# Patient Record
Sex: Female | Born: 1955 | Race: White | Hispanic: No | Marital: Married | Smoking: Never smoker
Health system: Southern US, Community
[De-identification: ages and names within clinical notes are randomized; demographics above are authoritative.]

## PROBLEM LIST (undated history)

## (undated) DIAGNOSIS — R569 Unspecified convulsions: Secondary | ICD-10-CM

## (undated) DIAGNOSIS — R51 Headache: Secondary | ICD-10-CM

## (undated) DIAGNOSIS — G43909 Migraine, unspecified, not intractable, without status migrainosus: Secondary | ICD-10-CM

## (undated) DIAGNOSIS — R519 Headache, unspecified: Secondary | ICD-10-CM

## (undated) HISTORY — DX: Migraine, unspecified, not intractable, without status migrainosus: G43.909

## (undated) HISTORY — DX: Headache: R51

## (undated) HISTORY — DX: Headache, unspecified: R51.9

## (undated) HISTORY — DX: Unspecified convulsions: R56.9

---

## 2011-11-09 ENCOUNTER — Ambulatory Visit: Payer: Self-pay

## 2012-02-25 ENCOUNTER — Ambulatory Visit: Payer: Self-pay | Admitting: Family Medicine

## 2012-12-22 ENCOUNTER — Ambulatory Visit: Payer: Self-pay | Admitting: Neurology

## 2014-06-13 DIAGNOSIS — R519 Headache, unspecified: Secondary | ICD-10-CM | POA: Insufficient documentation

## 2014-06-13 DIAGNOSIS — R569 Unspecified convulsions: Secondary | ICD-10-CM | POA: Insufficient documentation

## 2014-06-13 DIAGNOSIS — G479 Sleep disorder, unspecified: Secondary | ICD-10-CM | POA: Insufficient documentation

## 2014-06-13 DIAGNOSIS — R51 Headache: Secondary | ICD-10-CM

## 2014-10-25 LAB — HM PAP SMEAR: HM Pap smear: NORMAL

## 2014-11-25 LAB — HM MAMMOGRAPHY: HM Mammogram: NEGATIVE

## 2015-07-08 ENCOUNTER — Encounter: Payer: Self-pay | Admitting: Podiatry

## 2015-07-08 ENCOUNTER — Ambulatory Visit (INDEPENDENT_AMBULATORY_CARE_PROVIDER_SITE_OTHER): Payer: BLUE CROSS/BLUE SHIELD | Admitting: Podiatry

## 2015-07-08 VITALS — BP 112/82 | HR 96 | Resp 16 | Ht 61.0 in | Wt 125.0 lb

## 2015-07-08 DIAGNOSIS — M79676 Pain in unspecified toe(s): Secondary | ICD-10-CM

## 2015-07-08 DIAGNOSIS — B351 Tinea unguium: Secondary | ICD-10-CM | POA: Diagnosis not present

## 2015-07-08 DIAGNOSIS — L603 Nail dystrophy: Secondary | ICD-10-CM | POA: Diagnosis not present

## 2015-07-08 NOTE — Patient Instructions (Signed)

## 2015-07-10 NOTE — Progress Notes (Signed)
Patient ID: Sherry Mathis, female   DOB: 08/09/1956, 59 y.o.   MRN: [id number]  Subjective: 59 year old female presents the office with concerns of right hallux toenail discoloration, thickening. She also states that over the last several weeks she has noticed redness around the entire toenail and there is tenderness palpation along the base of the toenail and it has been progressive. She's been applying Neosporin around the nail borders without any relief. She denies any history of injury or trauma. Denies any drainage or purulence around the toenail. No other complaints at this time.  Objective: AAO x3, NAD DP/PT pulses palpable bilaterally, CRT less than 3 seconds Protective sensation intact with Simms Weinstein monofilament, vibratory sensation intact, Achilles tendon reflex intact There is tenderness to patient along the right hallux toenail. There is slight discoloration, hypertrophy and dystrophy the toenail. There is evidence of incurvation along both the medial and lateral nail borders. The majority the tenderness is localized to the proximal nail border. There is erythema surrounding the entire nail on both the medial, lateral, proximal nail borders however does not extend past the level of the IPJ. There is no ascending cellulitis. There is slight edema around the nail borders. The toenail does appear to be lifting underlying nail bed.  No other areas of tenderness to bilateral lower extremities. MMT 5/5, ROM WNL.  No open lesions or pre-ulcerative lesions.  No overlying edema, erythema, increase in warmth to bilateral lower extremities.  No pain with calf compression, swelling, warmth, erythema bilaterally.   Assessment: 59 year old female right hallux toenail pain, onychodystrophy of localized erythema  Plan: -Treatment options discussed including all alternatives, risks, and complications -At this time, recommended total nail removal without chemical matricectomy. Risks and  complications were discussed with the patient for which they understand and  verbally consent to the procedure. Under sterile conditions a total of 3 mL of a mixture of 2% lidocaine plain and 0.5% Marcaine plain was infiltrated in a hallux block fashion. Once anesthetized, the skin was prepped in sterile fashion. A tourniquet was then applied. Next the right hallux nail  was excised making sure to remove the entire offending nail borders. Once the nail was removed, the area was debrided and the underlying skin was intact. The area was irrigated and hemostasis was obtained. No purulence was expressed during the procedure. A dry sterile dressing was applied. After application of the dressing the tourniquet was removed and there is found to be an immediate capillary refill time to the digit. The patient tolerated the procedure well any complications. Post procedure instructions were discussed the patient for which he verbally understood. Follow-up in one week for nail check or sooner if any problems are to arise. Discussed signs/symptoms of worsening infection and directed to call the office immediately should any occur or go directly to the emergency room. In the meantime, encouraged to call the office with any questions, concerns, changes symptoms.

## 2015-07-22 ENCOUNTER — Telehealth: Payer: Self-pay | Admitting: *Deleted

## 2015-07-22 NOTE — Telephone Encounter (Signed)
Left message for patient that negative fungal can try nuvail if she wanted , call us back with any questions

## 2015-07-24 ENCOUNTER — Ambulatory Visit (INDEPENDENT_AMBULATORY_CARE_PROVIDER_SITE_OTHER): Payer: BLUE CROSS/BLUE SHIELD | Admitting: Podiatry

## 2015-07-24 DIAGNOSIS — L603 Nail dystrophy: Secondary | ICD-10-CM

## 2015-07-24 DIAGNOSIS — Z9889 Other specified postprocedural states: Secondary | ICD-10-CM

## 2015-07-24 NOTE — Patient Instructions (Signed)

## 2015-07-24 NOTE — Progress Notes (Signed)
Patient ID: HAILIE SEARIGHT, female   DOB: 03-11-1956, 59 y.o.   MRN: [id number]  Subjective:  59 year old female presents the office they for follow-up evaluation status post right hallux nail excision. She states that she's continue soaking her foot twice a day Epson salts cover with antibody ointment and a Band-Aid in the day. She was the area uncovered at night. She denies any pain around the area. She does state there is some slight discoloration along on the nail border which she points the proximal nail border although it does appear to be improving. She denies any red streaks. Denies any drainage or purulence. No other complaints at this time in no acute changes.  Objective: AAO x3, NAD Neurovascular status intact Status post right hallux total nail excision. There is a small amount granulation tissue present within the proximal nail border however the remainder of the nail bed and scabbed over. There is no tenderness palpation of the procedure site. There is a faint amount of erythema on the proximal nail border although this appears to be very mild and localized. There is no ascending saline disc. There is no areas of  Fluctuance or crepitus. No drainage or purulence. No malodor.  No other areas of tenderness to bilateral lower extremities. MMT 5/5, ROM WNL.  No open lesions or pre-ulcerative lesions.  No overlying edema, erythema, increase in warmth to bilateral lower extremities.  No pain with calf compression, swelling, warmth, erythema bilaterally.   Assessment: 59 year old female s/p right hallux total nail excision  Plan: Continue soaking in epsom salts twice a day followed by antibiotic ointment and a band-aid. Can leave uncovered at night. Continue this until completely healed.  If the area has not healed in 2 weeks, call the office for follow-up appointment, or sooner if any problems arise.  She wishes to hold off on nuvail or other treatment for her nails.  Monitor for any  signs/symptoms of infection. Call the office immediately if any occur or go directly to the emergency room. Call with any questions/concerns.  Ovid Curd, DPM

## 2015-08-01 ENCOUNTER — Encounter: Payer: Self-pay | Admitting: Podiatry

## 2015-10-06 ENCOUNTER — Telehealth: Payer: Self-pay | Admitting: *Deleted

## 2015-10-06 NOTE — Telephone Encounter (Signed)
Pt states she had a toenail removed 07/08/2015 and was calling about the path report.  Left message informing pt the fungal results were negative, could use OTC Revitaderm40 sold in the [facility] offices, apply to the toenails and it made them easier to trim and file thinner which often helped the appearance.

## 2015-10-30 ENCOUNTER — Ambulatory Visit (INDEPENDENT_AMBULATORY_CARE_PROVIDER_SITE_OTHER): Payer: BLUE CROSS/BLUE SHIELD | Admitting: Nurse Practitioner

## 2015-10-30 ENCOUNTER — Other Ambulatory Visit: Payer: Self-pay | Admitting: Nurse Practitioner

## 2015-10-30 ENCOUNTER — Encounter: Payer: Self-pay | Admitting: Nurse Practitioner

## 2015-10-30 VITALS — BP 104/80 | HR 77 | Temp 98.2°F | Resp 14 | Ht 61.0 in | Wt 125.8 lb

## 2015-10-30 DIAGNOSIS — Z1239 Encounter for other screening for malignant neoplasm of breast: Secondary | ICD-10-CM

## 2015-10-30 DIAGNOSIS — Z7689 Persons encountering health services in other specified circumstances: Secondary | ICD-10-CM

## 2015-10-30 DIAGNOSIS — R569 Unspecified convulsions: Secondary | ICD-10-CM | POA: Diagnosis not present

## 2015-10-30 DIAGNOSIS — R51 Headache: Secondary | ICD-10-CM | POA: Diagnosis not present

## 2015-10-30 DIAGNOSIS — Z7189 Other specified counseling: Secondary | ICD-10-CM | POA: Diagnosis not present

## 2015-10-30 DIAGNOSIS — R519 Headache, unspecified: Secondary | ICD-10-CM

## 2015-10-30 NOTE — Patient Instructions (Addendum)
Hepatitis C Screening (Hepatitis C Antibody [facility] lab) HIV Screening- (HIV Antibody with reflex- [facility])  Nice to meet you today.   Please follow up for your annual physical with fasting lab work.

## 2015-10-30 NOTE — Progress Notes (Signed)
Pre visit review using our clinic review tool, if applicable. No additional management support is needed unless otherwise documented below in the visit note. 

## 2015-10-30 NOTE — Progress Notes (Signed)
Patient ID: Sherry Mathis Suppes, female    DOB: 08/18/1956  Age: 59 y.o. MRN: [id number]  CC: Establish Care   HPI Sherry Mathis Dicioccio presents for establishing care and CC of mammogram order needed.   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 of 2015, no episodes since then   Migraines/Headaches- 18 per year approx, no visual changes, migraine medication like excedrin, rest and cold pack.   3) Acute Problems- Needs order for mammogram   [facility] Dr. Ardelle AntonWagoner- [facility]- great toe on right foot  [facility] Georgianne [facility]Vin [facility]  Dr. Ashley RoyaltyMatthews- Dentist   History Sherry Mathis has a past medical history of Seizures (HCC); Frequent headaches; and Migraines.   She has no past surgical history on file.   Her family history includes Cancer in her maternal aunt and mother.She reports that she has never smoked. She has never used smokeless tobacco. She reports that she drinks alcohol. She reports that she does not use illicit drugs.  No outpatient prescriptions prior to visit.   No facility-administered medications prior to visit.    ROS Review of Systems  Constitutional: Negative for fever, chills, diaphoresis and fatigue.  Respiratory: Negative for chest tightness, shortness of breath and wheezing.   Cardiovascular: Negative for chest pain, palpitations and leg swelling.  Gastrointestinal: Negative for nausea, vomiting and diarrhea.  Skin: Negative for rash.  Neurological: Negative for dizziness, weakness, numbness and headaches.  Psychiatric/Behavioral: The patient is not nervous/anxious.     Objective:  BP 104/80 mmHg  Pulse 77  Temp(Src) 98.2 F (36.8 C)  Resp 14   Ht 5\' 1"  (1.549 m)  Wt 125 lb 12.8 oz (57.063 kg)  BMI 23.78 kg/m2  SpO2 95%  Physical Exam  Constitutional: She is oriented to person, place, and time. She appears well-developed and well-nourished. No distress.  HENT:  Head: Normocephalic and atraumatic.  Right Ear: External ear normal.  Left Ear: External ear normal.  Cardiovascular: Normal rate, regular rhythm and normal heart sounds.  Exam reveals no gallop and no friction rub.   No murmur heard. Pulmonary/Chest: Effort normal and breath sounds normal. No respiratory distress. She has no wheezes. She has no rales. She exhibits no tenderness.  Neurological: She is alert and oriented to person, place, and time. No cranial nerve deficit. She exhibits normal muscle tone. Coordination normal.  Skin: Skin is warm and dry. No rash noted. She is not diaphoretic.  Psychiatric: She has a normal mood and affect. Her behavior is normal. Judgment and thought content normal.   Assessment & Plan:   Sherry Mathis was seen today for establish care.  Diagnoses and all orders for this visit:  Screening for breast cancer -     MM Digital Screening; Future  Encounter to establish care  Seizure [facility](HCC)  Nonintractable headache, unspecified chronicity pattern, unspecified headache type   I am having Ms. Walpole maintain her aspirin and Glucosamine-Chondroitin.  Meds ordered this encounter  Medications  . aspirin 81 MG tablet    Sig: Take 81 mg by mouth daily.  . Glucosamine-Chondroitin 750-600 MG CHEW    Sig: Chew 1 capsule by  mouth daily.     Follow-up: Return in about 3 months (around 01/30/2016) for CPE with fasting labs.

## 2015-11-03 DIAGNOSIS — Z7689 Persons encountering health services in other specified circumstances: Secondary | ICD-10-CM | POA: Insufficient documentation

## 2015-11-03 DIAGNOSIS — Z1239 Encounter for other screening for malignant neoplasm of breast: Secondary | ICD-10-CM | POA: Insufficient documentation

## 2015-11-03 NOTE — Assessment & Plan Note (Signed)
Discussed acute and chronic issues. Reviewed health maintenance measures, PFSHx, and immunizations. Obtain records from previous facility.   

## 2015-11-03 NOTE — Assessment & Plan Note (Signed)
Grand Mal seizure in past (2010) with "spells". Turned out to be epilepsy. Stable currently and is driving once again. Saw [facility] at [facility].

## 2015-11-03 NOTE — Assessment & Plan Note (Signed)
18 years approx of headaches. Denies aura. Migraine OTC medication and rest is helpful. Will follow

## 2015-11-03 NOTE — Assessment & Plan Note (Addendum)
Mammogram order placed for Sherry Mathis. Pt was given number to call and set up. Denies implants or past problems. Will follow

## 2015-11-27 ENCOUNTER — Ambulatory Visit
Admission: RE | Admit: 2015-11-27 | Discharge: 2015-11-27 | Disposition: A | Discharge status: Home / Self Care | Payer: BLUE CROSS/BLUE SHIELD | Source: Ambulatory Visit | Attending: Nurse Practitioner | Admitting: Nurse Practitioner

## 2015-11-27 DIAGNOSIS — Z1231 Encounter for screening mammogram for malignant neoplasm of breast: Secondary | ICD-10-CM | POA: Insufficient documentation

## 2015-11-27 DIAGNOSIS — Z1239 Encounter for other screening for malignant neoplasm of breast: Secondary | ICD-10-CM

## 2016-11-10 ENCOUNTER — Other Ambulatory Visit: Payer: Self-pay | Admitting: Obstetrics and Gynecology

## 2016-11-10 DIAGNOSIS — Z1231 Encounter for screening mammogram for malignant neoplasm of breast: Secondary | ICD-10-CM

## 2016-12-21 ENCOUNTER — Ambulatory Visit
Admission: RE | Admit: 2016-12-21 | Discharge: 2016-12-21 | Disposition: A | Discharge status: Home / Self Care | Payer: BLUE CROSS/BLUE SHIELD | Source: Ambulatory Visit | Attending: Obstetrics and Gynecology | Admitting: Obstetrics and Gynecology

## 2016-12-21 DIAGNOSIS — Z1231 Encounter for screening mammogram for malignant neoplasm of breast: Secondary | ICD-10-CM | POA: Insufficient documentation

## 2017-11-11 ENCOUNTER — Other Ambulatory Visit: Payer: Self-pay | Admitting: Obstetrics and Gynecology

## 2017-11-11 DIAGNOSIS — Z1231 Encounter for screening mammogram for malignant neoplasm of breast: Secondary | ICD-10-CM

## 2017-12-22 ENCOUNTER — Ambulatory Visit
Admission: RE | Admit: 2017-12-22 | Discharge: 2017-12-22 | Disposition: A | Discharge status: Home / Self Care | Payer: BLUE CROSS/BLUE SHIELD | Source: Ambulatory Visit | Attending: Obstetrics and Gynecology | Admitting: Obstetrics and Gynecology

## 2017-12-22 DIAGNOSIS — Z1231 Encounter for screening mammogram for malignant neoplasm of breast: Secondary | ICD-10-CM

## 2018-09-04 ENCOUNTER — Other Ambulatory Visit: Payer: Self-pay | Admitting: Obstetrics and Gynecology

## 2018-09-04 DIAGNOSIS — Z1231 Encounter for screening mammogram for malignant neoplasm of breast: Secondary | ICD-10-CM

## 2018-12-25 ENCOUNTER — Ambulatory Visit
Admission: RE | Admit: 2018-12-25 | Discharge: 2018-12-25 | Disposition: A | Discharge status: Home / Self Care | Payer: BLUE CROSS/BLUE SHIELD | Source: Ambulatory Visit | Attending: Obstetrics and Gynecology | Admitting: Obstetrics and Gynecology

## 2018-12-25 DIAGNOSIS — Z1231 Encounter for screening mammogram for malignant neoplasm of breast: Secondary | ICD-10-CM | POA: Diagnosis not present

## 2019-04-17 ENCOUNTER — Ambulatory Visit: Admission: RE | Admit: 2019-04-17 | Payer: Medicare HMO | Source: Home / Self Care | Admitting: Gastroenterology

## 2019-04-17 ENCOUNTER — Encounter: Admission: RE | Payer: Self-pay | Source: Home / Self Care

## 2019-04-17 SURGERY — COLONOSCOPY WITH PROPOFOL
Anesthesia: General

## 2020-04-24 ENCOUNTER — Other Ambulatory Visit: Payer: Self-pay | Admitting: Family Medicine

## 2020-04-24 ENCOUNTER — Other Ambulatory Visit: Payer: Self-pay | Admitting: Obstetrics and Gynecology

## 2020-04-24 DIAGNOSIS — Z1231 Encounter for screening mammogram for malignant neoplasm of breast: Secondary | ICD-10-CM

## 2020-04-25 ENCOUNTER — Ambulatory Visit
Admission: RE | Admit: 2020-04-25 | Discharge: 2020-04-25 | Disposition: A | Discharge status: Home / Self Care | Payer: Medicare HMO | Source: Ambulatory Visit | Attending: Family Medicine | Admitting: Family Medicine

## 2020-04-25 DIAGNOSIS — Z1231 Encounter for screening mammogram for malignant neoplasm of breast: Secondary | ICD-10-CM

## 2020-12-27 HISTORY — PX: COLONOSCOPY: SHX174

## 2021-01-21 ENCOUNTER — Other Ambulatory Visit: Payer: Self-pay | Admitting: Family Medicine

## 2021-01-21 DIAGNOSIS — Z1231 Encounter for screening mammogram for malignant neoplasm of breast: Secondary | ICD-10-CM

## 2021-05-07 ENCOUNTER — Other Ambulatory Visit: Payer: Self-pay

## 2021-05-07 ENCOUNTER — Ambulatory Visit
Admission: RE | Admit: 2021-05-07 | Discharge: 2021-05-07 | Disposition: A | Discharge status: Home / Self Care | Payer: Medicare HMO | Source: Ambulatory Visit | Attending: Family Medicine | Admitting: Family Medicine

## 2021-05-07 DIAGNOSIS — Z1231 Encounter for screening mammogram for malignant neoplasm of breast: Secondary | ICD-10-CM | POA: Diagnosis not present

## 2022-03-29 ENCOUNTER — Other Ambulatory Visit: Payer: Self-pay | Admitting: Family Medicine

## 2022-03-29 DIAGNOSIS — Z1231 Encounter for screening mammogram for malignant neoplasm of breast: Secondary | ICD-10-CM

## 2022-05-10 ENCOUNTER — Ambulatory Visit
Admission: RE | Admit: 2022-05-10 | Discharge: 2022-05-10 | Disposition: A | Discharge status: Home / Self Care | Payer: Medicare HMO | Source: Ambulatory Visit | Attending: Family Medicine | Admitting: Family Medicine

## 2022-05-10 DIAGNOSIS — Z1231 Encounter for screening mammogram for malignant neoplasm of breast: Secondary | ICD-10-CM | POA: Diagnosis present

## 2022-05-14 ENCOUNTER — Other Ambulatory Visit: Payer: Self-pay | Admitting: Family Medicine

## 2022-05-14 ENCOUNTER — Ambulatory Visit
Admission: RE | Admit: 2022-05-14 | Discharge: 2022-05-14 | Disposition: A | Discharge status: Home / Self Care | Payer: Medicare HMO | Source: Ambulatory Visit | Attending: Family Medicine | Admitting: Family Medicine

## 2022-05-14 DIAGNOSIS — R921 Mammographic calcification found on diagnostic imaging of breast: Secondary | ICD-10-CM

## 2022-05-14 DIAGNOSIS — R928 Other abnormal and inconclusive findings on diagnostic imaging of breast: Secondary | ICD-10-CM | POA: Diagnosis present

## 2022-05-18 ENCOUNTER — Other Ambulatory Visit: Payer: Self-pay | Admitting: Family Medicine

## 2022-05-18 DIAGNOSIS — R928 Other abnormal and inconclusive findings on diagnostic imaging of breast: Secondary | ICD-10-CM

## 2022-05-18 DIAGNOSIS — R921 Mammographic calcification found on diagnostic imaging of breast: Secondary | ICD-10-CM

## 2022-05-23 IMAGING — MG MM DIGITAL SCREENING BILAT W/ TOMO AND CAD
6 of 10 series · 6 of 30 positions shown · non-contrast
Comparison: Previous exam(s).

CLINICAL DATA: Screening.

EXAM:
DIGITAL SCREENING BILATERAL MAMMOGRAM WITH TOMOSYNTHESIS AND CAD
TECHNIQUE: Bilateral screening digital craniocaudal and mediolateral oblique
mammograms were obtained. Bilateral screening digital breast
tomosynthesis was performed. The images were evaluated with
computer-aided detection.

[L MLO synth-2D]
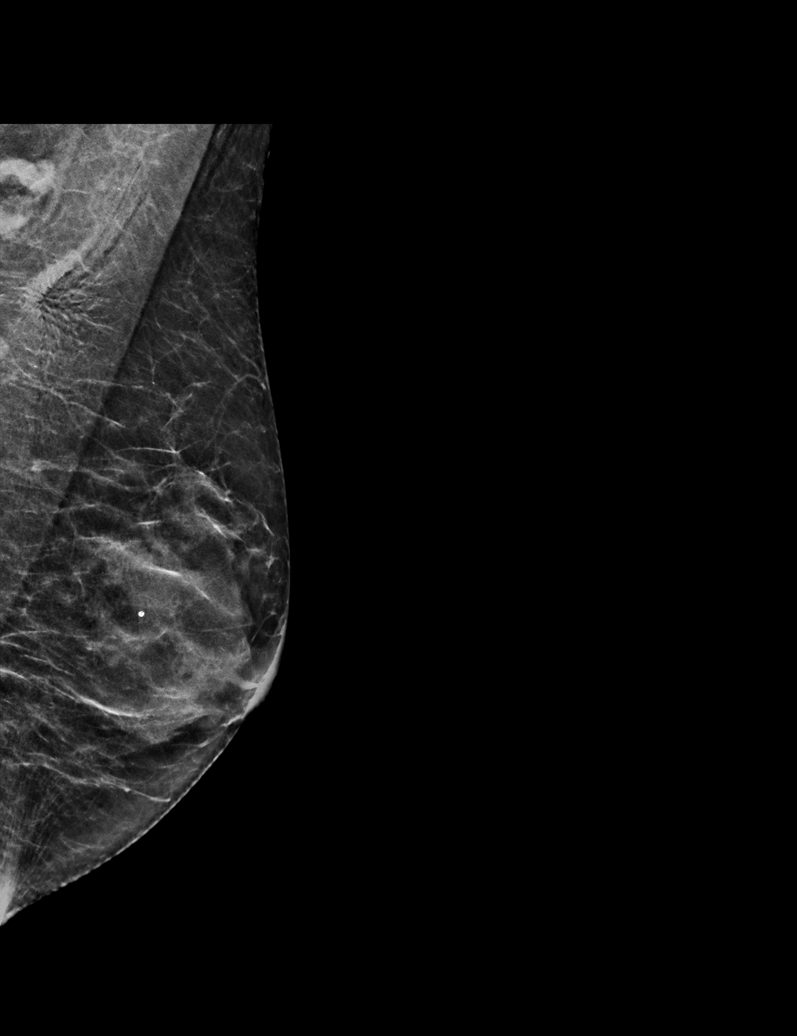

[R CC synth-2D]
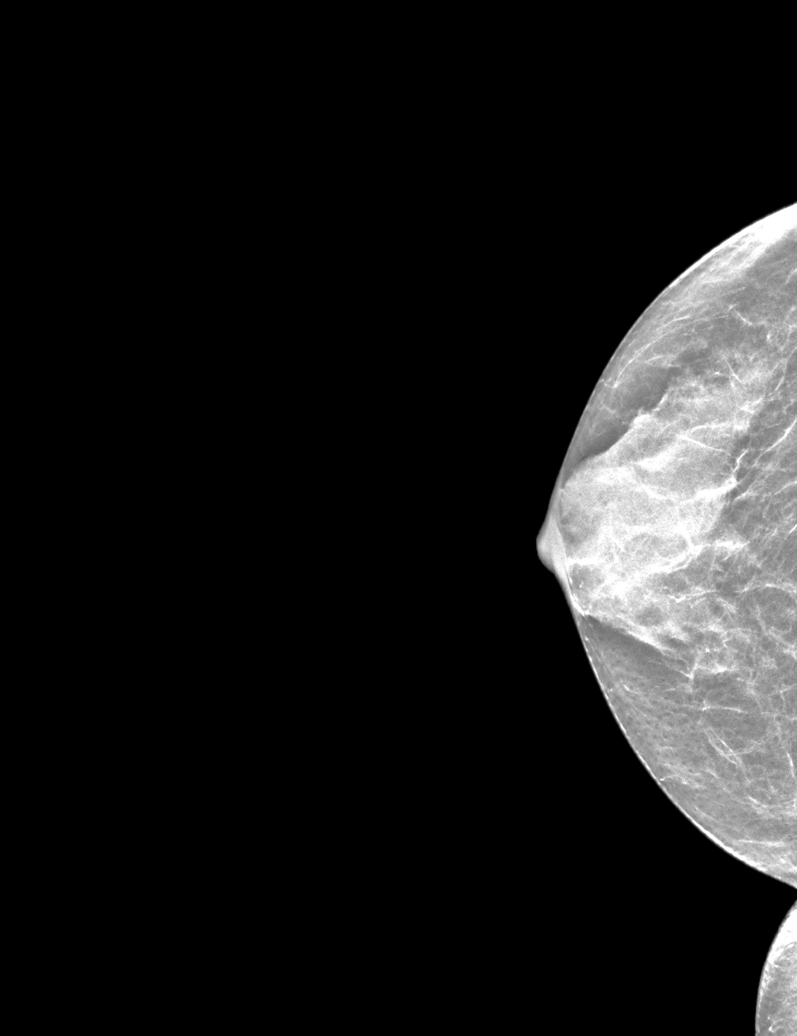

[R MLO synth-2D (1 of 2)]
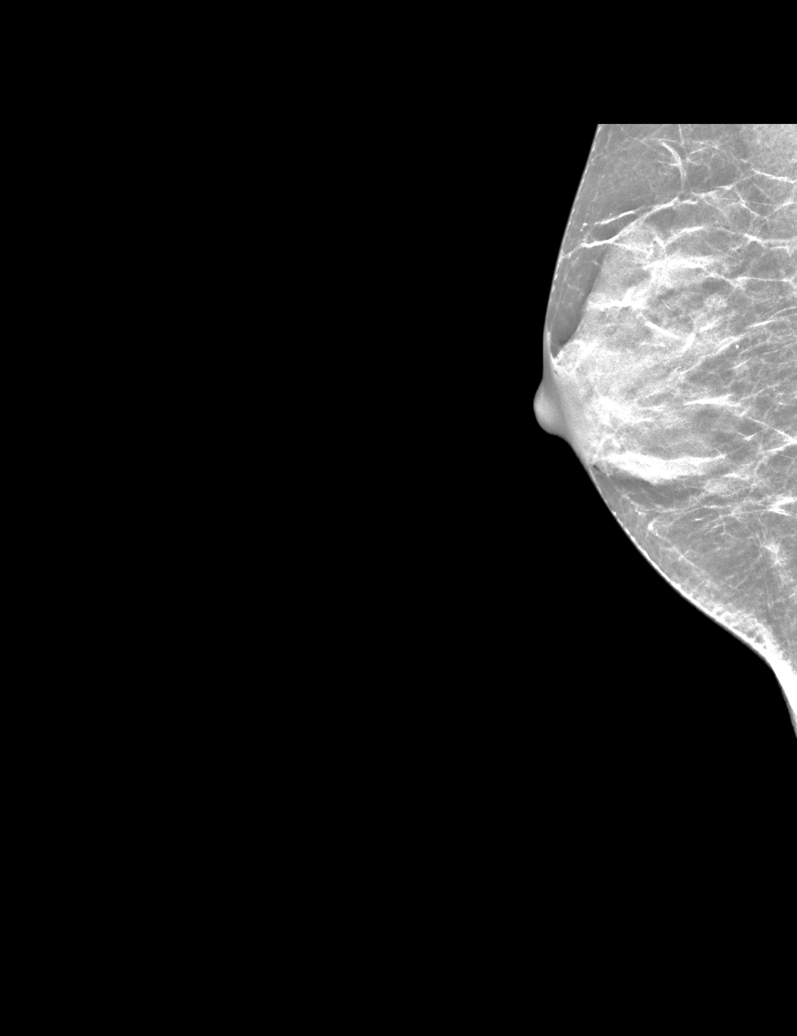

[R MLO synth-2D (2 of 2)]
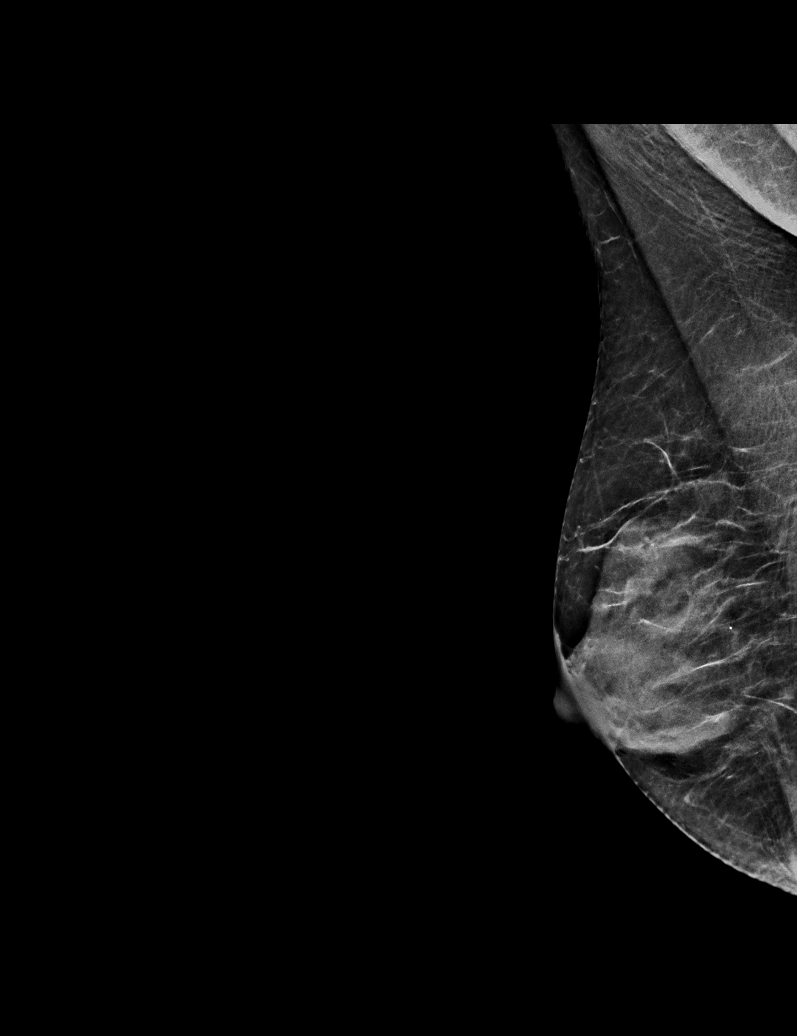

[L CC synth-2D]
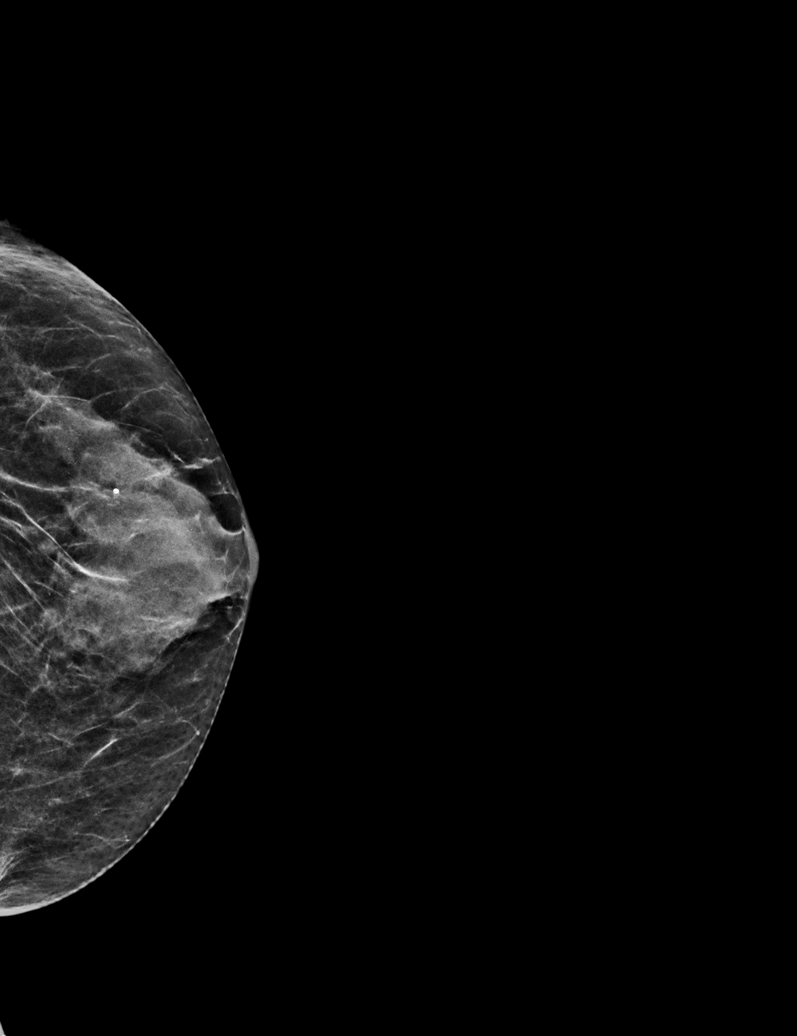

[L CC tomo · tomo slice 23/44.0]
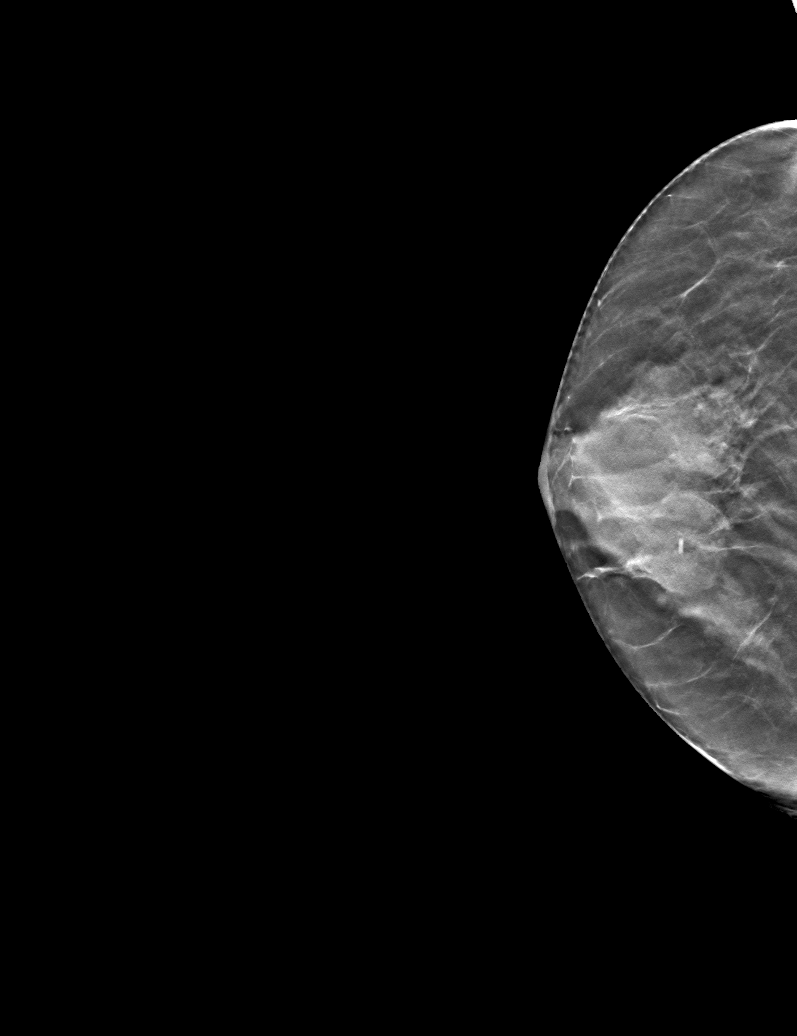

[6 of 30 positions shown; findings below may reference images not displayed]

ACR Breast Density Category c: The breast tissue is heterogeneously
dense, which may obscure small masses.
FINDINGS: There are no findings suspicious for malignancy. The images were
evaluated with computer-aided detection.
IMPRESSION: No mammographic evidence of malignancy. A result letter of this
screening mammogram will be mailed directly to the patient.

RECOMMENDATION:
Screening mammogram in one year. (Code:T4-5-GWO)

BI-RADS CATEGORY  1: Negative.

## 2022-06-02 ENCOUNTER — Ambulatory Visit
Admission: RE | Admit: 2022-06-02 | Discharge: 2022-06-02 | Disposition: A | Discharge status: Home / Self Care | Payer: Medicare HMO | Source: Ambulatory Visit | Attending: Family Medicine | Admitting: Family Medicine

## 2022-06-02 DIAGNOSIS — R921 Mammographic calcification found on diagnostic imaging of breast: Secondary | ICD-10-CM | POA: Insufficient documentation

## 2022-06-02 DIAGNOSIS — R928 Other abnormal and inconclusive findings on diagnostic imaging of breast: Secondary | ICD-10-CM | POA: Diagnosis present

## 2022-06-02 HISTORY — PX: BREAST BIOPSY: SHX20

## 2022-06-03 ENCOUNTER — Encounter: Payer: Self-pay | Admitting: *Deleted

## 2022-06-03 LAB — SURGICAL PATHOLOGY

## 2022-06-03 NOTE — Progress Notes (Signed)
Referral recieved from [facility] for Left breast atypical lobular hyperplasia.  Appointment scheduled for surgical consultation with Dr. Lemar Livings per patient request on 06/10/22.   No further needs at this time.

## 2022-06-10 ENCOUNTER — Other Ambulatory Visit: Payer: Self-pay | Admitting: General Surgery

## 2022-06-10 DIAGNOSIS — D0502 Lobular carcinoma in situ of left breast: Secondary | ICD-10-CM

## 2022-06-10 NOTE — Progress Notes (Signed)
Subjective:     Patient ID: Sherry Mathis is a 66 y.o. female.   HPI   The following portions of the patient's history were reviewed and updated as appropriate.   This a new patient is here today for: office visit. She is here to discuss treatment for her most recent left breast biopsy, 06-02-22 referred by Dr Kary Kos. She states she does self breast checks and could not fele anything in the breast prior to her mammogram. She denies any breast issues or trauma.   She states she wears a 36 AA bra.   The patient, a native of [place], [place] met her husband when she was attending [employer].  She was an Equities trader education major.  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  Fortunately, not an unpleasant memory.   She is here with her husband, Jeneen Rinks.   Review of Systems  Constitutional:  Negative for chills and fever.  Respiratory:  Negative for cough.        Chief Complaint  Patient presents with   New Patient      BP 92/64   Pulse 90   Temp 36.9 C (98.4 F)   Ht 154.9 cm (5' 1")   Wt 51.3 kg (113 lb)   SpO2 98%   BMI 21.35 kg/m        Past Medical History:  Diagnosis Date   Headache 06/13/2014   Migraine     Seizures (CMS-HCC) 2010    one grand mal in 2010, none since-topamax x 5 years then d/c under Dr Melrose Nakayama   Sleep disorder 06/13/2014           Past Surgical History:  Procedure Laterality Date   COLONOSCOPY   2010    [place]-nml per pt.   COLONOSCOPY   12/23/2021    Int. hem/Otherwise normal colon/Repeat 48yr/SMR   BIOPSY BREAST W/ LOC DEVICE PLACEMENT AND STEREOTACTIC GUIDANCE Left 06/02/2022   ARTHROSCOPIC ROTATOR CUFF REPAIR            OB History       Gravida  1   Para  1   Term  1   Preterm      AB      Living  1        SAB      IAB      Ectopic      Molar      Multiple      Live Births  1        Obstetric Comments  Age at first period 89Age of first pregnancy 89              Social  History           Socioeconomic History   Marital status: Married  Tobacco Use   Smoking status: Never   Smokeless tobacco: Never  Substance and Sexual Activity   Alcohol use: Yes      Alcohol/week: 0.0 standard drinks      Comment: occasional   Drug use: No   Sexual activity: Yes      Partners: Male      Birth control/protection: Post-menopausal  Other Topics Concern   Would you please tell uKoreaabout the people who live in your home, your pets, or anything else important to your social life? No        No Known Allergies   Current Medications        Current Outpatient Medications  Medication Sig Dispense Refill   ascorbic acid, vitamin C, 250 mg Chew Take 1 tablet by mouth once daily       aspirin 81 MG chewable tablet Take 81 mg by mouth once daily       calcium carbonate (CALCIUM 600 ORAL) Take by mouth       erythromycin (ROMYCIN) ophthalmic ointment Place 0.5 inches into the left eye as needed       multivitamin Chew Take 1 tablet by mouth once daily        No current facility-administered medications for this visit.             Family History  Problem Relation Age of Onset   Breast cancer Mother 76   Heart failure Father 28   Cancer Maternal Grandmother          lung   Myocardial Infarction (Heart attack) Maternal Grandfather     Cancer Paternal Grandfather          liver   Breast cancer Maternal Aunt 99        Labs and Radiology:    June 02, 2022 stereotactic biopsy:   DIAGNOSIS:  A.  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  - CALCIFICATION IDENTIFIED.    COMMENT: After the first levels on block A are examined and no  calcifications seen the block is leveled thru which reveals  calcification.    Estimated volume of tissue removed: 2 cm.   Imaging review:   Mammograms from April 25, 2020 through June 02, 2022 were  reviewed.  New development of microcalcifications on retrospective review of the Feb 14, 2021 films, more pronounced in 02-14-22.   Review of the postbiopsy films shows no residual microcalcifications.  Notation made by the radiologist that the clip is migrated 6 mm medial to the biopsy site.   August 05, 2021 laboratory:   Glucose 70 - 110 mg/dL 86  Sodium 136 - 145 mmol/L 140  Potassium 3.6 - 5.1 mmol/L 4.1  Chloride 97 - 109 mmol/L 105  Carbon Dioxide (CO2) 22.0 - 32.0 mmol/L 29.0  Urea Nitrogen (BUN) 7 - 25 mg/dL 15  Creatinine 0.6 - 1.1 mg/dL 0.8  Glomerular Filtration Rate (eGFR), MDRD Estimate >60 mL/min/1.73sq m 72  Calcium 8.7 - 10.3 mg/dL 9.4  AST 8 - 39 U/L 16  ALT 5 - 38 U/L 13  Alk Phos (alkaline Phosphatase) 34 - 104 U/L 63  Albumin 3.5 - 4.8 g/dL 4.3  Bilirubin, Total 0.3 - 1.2 mg/dL 0.6  Protein, Total 6.1 - 7.9 g/dL 7.0  A/G Ratio 1.0 - 5.0 gm/dL 1.6    WBC (White Blood Cell Count) 4.1 - 10.2 10^3/uL 5.4  RBC (Red Blood Cell Count) 4.04 - 5.48 10^6/uL 4.66  Hemoglobin 12.0 - 15.0 gm/dL 13.5  Hematocrit 35.0 - 47.0 % 42.9  MCV (Mean Corpuscular Volume) 80.0 - 100.0 fl 92.1  MCH (Mean Corpuscular Hemoglobin) 27.0 - 31.2 pg 29.0  MCHC (Mean Corpuscular Hemoglobin Concentration) 32.0 - 36.0 gm/dL 31.5 Low   Platelet Count 150 - 450 10^3/uL 211  RDW-CV (Red Cell Distribution Width) 11.6 - 14.8 % 12.6  MPV (Mean Platelet Volume) 9.4 - 12.4 fl 10.4  Neutrophils 1.50 - 7.80 10^3/uL 2.50  Lymphocytes 1.00 - 3.60 10^3/uL 2.50  Mixed Count 0.10 - 0.90 10^3/uL 0.40  Neutrophil % 32.0 - 70.0 % 45.7  Lymphocyte % 10.0 - 50.0 %  46.0  Mixed % 3.0 - 14.4 % 8.3    BRCA testing:   The patient reports that she underwent BRCA testing through her gynecologist on October 23, 2012 and had no abnormalities detected.  This was completed at [place].  She will look to see if a copy is available in her records.  Review of the [facility] EMR is unhelpful.    Objective:   Physical Exam Exam  conducted with a chaperone present.  Constitutional:      Appearance: Normal appearance.  Cardiovascular:     Rate and Rhythm: Normal rate and regular rhythm.     Pulses: Normal pulses.     Heart sounds: Normal heart sounds.  Pulmonary:     Effort: Pulmonary effort is normal.     Breath sounds: Normal breath sounds.  Chest:  Breasts:    Right: Normal.     Left: Normal.        Comments: Moderate bruising at the site of biopsy in the left breast.  Small hematoma.   6 mm pearllike lesion on the upper chest below the manubrium, concerning for basal cell carcinoma. Musculoskeletal:     Cervical back: Neck supple.  Lymphadenopathy:     Upper Body:     Right upper body: No supraclavicular or axillary adenopathy.     Left upper body: No supraclavicular or axillary adenopathy.  Skin:    General: Skin is warm and dry.  Neurological:     Mental Status: She is alert and oriented to person, place, and time.  Psychiatric:        Mood and Affect: Mood normal.        Behavior: Behavior normal.         Assessment:     LCIS of the left breast.   Possible basal cell carcinoma of the anterior chest.    Plan:     In light of the pre and postbiopsy films, likely all of the tissue of concern has been removed.  Indications for local excision to confirm no residual abnormal tissue is appropriate.   With the discrepancy between the clip and the original biopsy site, she will benefit from preoperative wire localization.   Excision of the area of concern in the skin of the anterior chest will be completed at the time of her biopsy.   We will attempt to obtain a copy of her BRCA testing to confirm her report.          This note is partially prepared by Karie Fetch, RN, acting as a scribe in the presence of Dr. Hervey Ard, MD.  The documentation recorded by the scribe accurately reflects the service I personally performed and the decisions made by me.    Robert Bellow, MD FACS

## 2022-06-11 ENCOUNTER — Other Ambulatory Visit: Payer: Self-pay | Admitting: General Surgery

## 2022-06-11 DIAGNOSIS — D0502 Lobular carcinoma in situ of left breast: Secondary | ICD-10-CM

## 2022-06-15 ENCOUNTER — Encounter
Admission: RE | Admit: 2022-06-15 | Discharge: 2022-06-15 | Disposition: A | Discharge status: Home / Self Care | Payer: Medicare HMO | Source: Ambulatory Visit | Attending: General Surgery | Admitting: General Surgery

## 2022-06-15 ENCOUNTER — Other Ambulatory Visit: Payer: Self-pay

## 2022-06-15 NOTE — Patient Instructions (Addendum)
Your procedure is scheduled on: Monday 06/21/22 Report to the Registration Desk on the 1st floor of the [facility]. To find out your arrival time, please call [phone] between 1PM - 3PM on: Friday 06/18/22 If your arrival time is 6:00 am, do not arrive prior to that time as the [facility] entrance doors do not open until 6:00 am.  REMEMBER: Instructions that are not followed completely may result in serious medical risk, up to and including death; or upon the discretion of your surgeon and anesthesiologist your surgery may need to be rescheduled.  Do not eat food after midnight the night before surgery.  No gum chewing, lozengers or hard candies.  You may however, drink CLEAR liquids up to 2 hours before you are scheduled to arrive for your surgery. Do not drink anything within 2 hours of your scheduled arrival time.  Clear liquids include: - water  - apple juice without pulp - gatorade (not RED colors) - black coffee or tea (Do NOT add milk or creamers to the coffee or tea) Do NOT drink anything that is not on this list.  TAKE THESE MEDICATIONS THE MORNING OF SURGERY WITH A SIP OF WATER: NONE  Hold Asprin day of surgery.  One week prior to surgery: Stop Anti-inflammatories (NSAIDS) such as Advil, Aleve, Ibuprofen, Motrin, Naproxen, Naprosyn and Aspirin based products such as Excedrin, Goodys Powder, BC Powder.  Stop taking your Glucosamine-Chondroitin 750-600 MG CHEW and Calcium ANY OVER THE COUNTER supplements until after surgery.  You may however, continue to take Tylenol if needed for pain up until the day of surgery.  No Alcohol for 24 hours before or after surgery.  No Smoking including e-cigarettes for 24 hours prior to surgery.  No chewable tobacco products for at least 6 hours prior to surgery.  No nicotine patches on the day of surgery.  Do not use any "recreational" drugs for at least a week prior to your surgery.  Please be advised that the combination of  cocaine and anesthesia may have negative outcomes, up to and including death. If you test positive for cocaine, your surgery will be cancelled.  On the morning of surgery brush your teeth with toothpaste and water, you may rinse your mouth with mouthwash if you wish. Do not swallow any toothpaste or mouthwash.  Use CHG wipes the morning of surgery at the hospital.  Do not wear jewelry, make-up, hairpins, clips or nail polish.  Do not wear lotions, powders, or perfumes.   Do not shave body from the neck down 48 hours prior to surgery just in case you cut yourself which could leave a site for infection.  Also, freshly shaved skin may become irritated if using the CHG soap.  Do not bring valuables to the hospital. [facility] is not responsible for any missing/lost belongings or valuables.   Notify your doctor if there is any change in your medical condition (cold, fever, infection).  Wear comfortable clothing (specific to your surgery type) to the hospital.  After surgery, you can help prevent lung complications by doing breathing exercises.  Take deep breaths and cough every 1-2 hours.  If you are being discharged the day of surgery, you will not be allowed to drive home. You will need a responsible adult (18 years or older) to drive you home and stay with you that night.   If you are taking public transportation, you will need to have a responsible adult (18 years or older) with you. Please confirm  with your physician that it is acceptable to use public transportation.   Please call the Pre-admissions Testing Dept. at [phone] if you have any questions about these instructions.  Surgery Visitation Policy:  Patients undergoing a surgery or procedure may have two family members or support persons with them as long as the person is not COVID-19 positive or experiencing its symptoms.   Inpatient Visitation:    Visiting hours are 7 a.m. to 8 p.m. Up to four visitors are  allowed at one time in a patient room, including children. The visitors may rotate out with other people during the day. One designated support person (adult) may remain overnight.

## 2022-06-20 MED ORDER — CHLORHEXIDINE GLUCONATE CLOTH 2 % EX PADS: 6.0000 | MEDICATED_PAD | Freq: Once | CUTANEOUS | Status: DC

## 2022-06-20 MED ORDER — LACTATED RINGERS IV SOLN: INTRAVENOUS | Status: DC

## 2022-06-20 MED ORDER — CHLORHEXIDINE GLUCONATE 0.12 % MT SOLN: 15.0000 mL | Freq: Once | OROMUCOSAL | Status: AC

## 2022-06-20 MED ORDER — ORAL CARE MOUTH RINSE: 15.0000 mL | Freq: Once | OROMUCOSAL | Status: AC

## 2022-06-20 MED ORDER — FAMOTIDINE 20 MG PO TABS: 20.0000 mg | ORAL_TABLET | Freq: Once | ORAL | Status: AC

## 2022-06-21 ENCOUNTER — Other Ambulatory Visit: Payer: Self-pay

## 2022-06-21 ENCOUNTER — Ambulatory Visit
Admission: RE | Admit: 2022-06-21 | Discharge: 2022-06-21 | Disposition: A | Discharge status: Home / Self Care | Payer: Medicare HMO | Attending: General Surgery | Admitting: General Surgery

## 2022-06-21 ENCOUNTER — Ambulatory Visit: Payer: Medicare HMO | Admitting: Anesthesiology

## 2022-06-21 ENCOUNTER — Ambulatory Visit
Admission: RE | Admit: 2022-06-21 | Discharge: 2022-06-21 | Disposition: A | Discharge status: Home / Self Care | Payer: Medicare HMO | Source: Ambulatory Visit | Attending: General Surgery | Admitting: General Surgery

## 2022-06-21 ENCOUNTER — Encounter
Admission: RE | Disposition: A | Discharge status: Home / Self Care | Payer: Self-pay | Source: Home / Self Care | Attending: General Surgery

## 2022-06-21 ENCOUNTER — Encounter: Payer: Self-pay | Admitting: General Surgery

## 2022-06-21 DIAGNOSIS — D0502 Lobular carcinoma in situ of left breast: Secondary | ICD-10-CM | POA: Insufficient documentation

## 2022-06-21 HISTORY — PX: BREAST LUMPECTOMY WITH NEEDLE LOCALIZATION: SHX5759

## 2022-06-21 HISTORY — PX: MOLE REMOVAL: SHX2046

## 2022-06-21 HISTORY — PX: BREAST LUMPECTOMY: SHX2

## 2022-06-21 SURGERY — BREAST LUMPECTOMY WITH NEEDLE LOCALIZATION
Anesthesia: General | Site: Chest

## 2022-06-21 MED ORDER — PROPOFOL 10 MG/ML IV BOLUS
INTRAVENOUS | Status: DC | PRN
  Administered 2022-06-21: 100 mg via INTRAVENOUS

## 2022-06-21 MED ORDER — MIDAZOLAM HCL 2 MG/2ML IJ SOLN
INTRAMUSCULAR | Status: AC
  Filled 2022-06-21: qty 2

## 2022-06-21 MED ORDER — CHLORHEXIDINE GLUCONATE 0.12 % MT SOLN
OROMUCOSAL | Status: AC
  Administered 2022-06-21: 15 mL via OROMUCOSAL
  Filled 2022-06-21: qty 15

## 2022-06-21 MED ORDER — LIDOCAINE HCL (CARDIAC) PF 100 MG/5ML IV SOSY
PREFILLED_SYRINGE | INTRAVENOUS | Status: DC | PRN
  Administered 2022-06-21: 60 mg via INTRAVENOUS

## 2022-06-21 MED ORDER — FENTANYL CITRATE (PF) 100 MCG/2ML IJ SOLN
INTRAMUSCULAR | Status: AC
  Filled 2022-06-21: qty 2

## 2022-06-21 MED ORDER — MIDAZOLAM HCL 2 MG/2ML IJ SOLN
INTRAMUSCULAR | Status: DC | PRN
  Administered 2022-06-21: 2 mg via INTRAVENOUS

## 2022-06-21 MED ORDER — FENTANYL CITRATE (PF) 100 MCG/2ML IJ SOLN: 25.0000 ug | INTRAMUSCULAR | Status: DC | PRN

## 2022-06-21 MED ORDER — LACTATED RINGERS IV SOLN: INTRAVENOUS | Status: DC | PRN

## 2022-06-21 MED ORDER — PROMETHAZINE HCL 25 MG/ML IJ SOLN: 6.2500 mg | INTRAMUSCULAR | Status: DC | PRN

## 2022-06-21 MED ORDER — ACETAMINOPHEN 10 MG/ML IV SOLN: 1000.0000 mg | Freq: Once | INTRAVENOUS | Status: DC | PRN

## 2022-06-21 MED ORDER — STERILE WATER FOR IRRIGATION IR SOLN
Status: DC | PRN
  Administered 2022-06-21: 1

## 2022-06-21 MED ORDER — FENTANYL CITRATE (PF) 100 MCG/2ML IJ SOLN
INTRAMUSCULAR | Status: DC | PRN
  Administered 2022-06-21: 50 ug via INTRAVENOUS

## 2022-06-21 MED ORDER — ACETAMINOPHEN 10 MG/ML IV SOLN
INTRAVENOUS | Status: DC | PRN
  Administered 2022-06-21: 1000 mg via INTRAVENOUS

## 2022-06-21 MED ORDER — DEXAMETHASONE SODIUM PHOSPHATE 10 MG/ML IJ SOLN
INTRAMUSCULAR | Status: DC | PRN
  Administered 2022-06-21: 8 mg via INTRAVENOUS

## 2022-06-21 MED ORDER — ACETAMINOPHEN 10 MG/ML IV SOLN
INTRAVENOUS | Status: AC
  Filled 2022-06-21: qty 100

## 2022-06-21 MED ORDER — BUPIVACAINE-EPINEPHRINE (PF) 0.5% -1:200000 IJ SOLN
INTRAMUSCULAR | Status: DC | PRN
  Administered 2022-06-21: 25 mL

## 2022-06-21 MED ORDER — PHENYLEPHRINE HCL-NACL 20-0.9 MG/250ML-% IV SOLN
INTRAVENOUS | Status: DC | PRN
  Administered 2022-06-21: 50 ug/min via INTRAVENOUS

## 2022-06-21 MED ORDER — OXYCODONE HCL 5 MG/5ML PO SOLN: 5.0000 mg | Freq: Once | ORAL | Status: DC | PRN

## 2022-06-21 MED ORDER — TRAMADOL HCL 50 MG PO TABS: 50.0000 mg | ORAL_TABLET | Freq: Four times a day (QID) | ORAL | 0 refills | Status: DC | PRN

## 2022-06-21 MED ORDER — BUPIVACAINE-EPINEPHRINE (PF) 0.5% -1:200000 IJ SOLN
INTRAMUSCULAR | Status: AC
  Filled 2022-06-21: qty 30

## 2022-06-21 MED ORDER — DROPERIDOL 2.5 MG/ML IJ SOLN: 0.6250 mg | Freq: Once | INTRAMUSCULAR | Status: DC | PRN

## 2022-06-21 MED ORDER — ONDANSETRON HCL 4 MG/2ML IJ SOLN
INTRAMUSCULAR | Status: DC | PRN
  Administered 2022-06-21: 4 mg via INTRAVENOUS

## 2022-06-21 MED ORDER — FAMOTIDINE 20 MG PO TABS
ORAL_TABLET | ORAL | Status: AC
  Administered 2022-06-21: 20 mg via ORAL
  Filled 2022-06-21: qty 1

## 2022-06-21 MED ORDER — PHENYLEPHRINE HCL (PRESSORS) 10 MG/ML IV SOLN
INTRAVENOUS | Status: DC | PRN
  Administered 2022-06-21: 160 ug via INTRAVENOUS

## 2022-06-21 MED ORDER — OXYCODONE HCL 5 MG PO TABS: 5.0000 mg | ORAL_TABLET | Freq: Once | ORAL | Status: DC | PRN

## 2022-06-21 SURGICAL SUPPLY — 46 items
APL PRP STRL LF DISP 70% ISPRP (MISCELLANEOUS) ×2
BINDER BREAST MEDIUM (GAUZE/BANDAGES/DRESSINGS) ×1 IMPLANT
BLADE SURG 15 STRL LF DISP TIS (BLADE) IMPLANT
BLADE SURG 15 STRL SS (BLADE) ×3
BLADE SURG 15 STRL SS SAFETY (BLADE) ×6 IMPLANT
CHLORAPREP W/TINT 26 (MISCELLANEOUS) ×3 IMPLANT
COVER PROBE FLX POLY STRL (MISCELLANEOUS) ×3 IMPLANT
DEVICE DUBIN SPECIMEN MAMMOGRA (MISCELLANEOUS) ×3 IMPLANT
DRAPE LAPAROTOMY TRNSV 106X77 (MISCELLANEOUS) ×3 IMPLANT
DRSG GAUZE FLUFF 36X18 (GAUZE/BANDAGES/DRESSINGS) ×6 IMPLANT
DRSG TEGADERM 2-3/8X2-3/4 SM (GAUZE/BANDAGES/DRESSINGS) ×1 IMPLANT
DRSG TELFA 3X8 NADH (GAUZE/BANDAGES/DRESSINGS) ×3 IMPLANT
ELECT CAUTERY BLADE TIP 2.5 (TIP) ×3
ELECT REM PT RETURN 9FT ADLT (ELECTROSURGICAL) ×3
ELECTRODE CAUTERY BLDE TIP 2.5 (TIP) ×2 IMPLANT
ELECTRODE REM PT RTRN 9FT ADLT (ELECTROSURGICAL) ×2 IMPLANT
GAUZE 4X4 16PLY ~~LOC~~+RFID DBL (SPONGE) ×3 IMPLANT
GLOVE BIO SURGEON STRL SZ7.5 (GLOVE) ×3 IMPLANT
GLOVE SURG UNDER LTX SZ8 (GLOVE) ×3 IMPLANT
GOWN STRL REUS W/ TWL LRG LVL3 (GOWN DISPOSABLE) ×4 IMPLANT
GOWN STRL REUS W/TWL LRG LVL3 (GOWN DISPOSABLE) ×6
KIT TURNOVER KIT A (KITS) ×3 IMPLANT
LABEL OR SOLS (LABEL) ×3 IMPLANT
MANIFOLD NEPTUNE II (INSTRUMENTS) ×3 IMPLANT
MARGIN MAP 10MM (MISCELLANEOUS) ×3 IMPLANT
NDL HYPO 25X1 1.5 SAFETY (NEEDLE) ×4 IMPLANT
NEEDLE HYPO 22GX1.5 SAFETY (NEEDLE) ×3 IMPLANT
NEEDLE HYPO 25X1 1.5 SAFETY (NEEDLE) ×6 IMPLANT
PACK BASIN MINOR ARMC (MISCELLANEOUS) ×3 IMPLANT
PAD DRESSING TELFA 3X8 NADH (GAUZE/BANDAGES/DRESSINGS) ×2 IMPLANT
PENCIL ELECTRO HAND CTR (MISCELLANEOUS) ×3 IMPLANT
STRIP CLOSURE SKIN 1/2X4 (GAUZE/BANDAGES/DRESSINGS) ×3 IMPLANT
SUT ETHILON 3-0 FS-10 30 BLK (SUTURE) ×3
SUT VIC AB 2-0 CT1 27 (SUTURE) ×6
SUT VIC AB 2-0 CT1 TAPERPNT 27 (SUTURE) ×4 IMPLANT
SUT VIC AB 3-0 SH 27 (SUTURE) ×3
SUT VIC AB 3-0 SH 27X BRD (SUTURE) ×4 IMPLANT
SUT VIC AB 4-0 FS2 27 (SUTURE) ×5 IMPLANT
SUTURE EHLN 3-0 FS-10 30 BLK (SUTURE) ×2 IMPLANT
SWABSTK COMLB BENZOIN TINCTURE (MISCELLANEOUS) ×3 IMPLANT
SYR 10ML LL (SYRINGE) ×3 IMPLANT
SYR BULB IRRIG 60ML STRL (SYRINGE) ×3 IMPLANT
TAPE TRANSPORE STRL 2 31045 (GAUZE/BANDAGES/DRESSINGS) ×3 IMPLANT
TRAP NEPTUNE SPECIMEN COLLECT (MISCELLANEOUS) ×3 IMPLANT
WATER STERILE IRR 1000ML POUR (IV SOLUTION) ×3 IMPLANT
WATER STERILE IRR 500ML POUR (IV SOLUTION) ×3 IMPLANT

## 2022-06-21 NOTE — Anesthesia Preprocedure Evaluation (Addendum)
Anesthesia Evaluation  Patient identified by MRN, date of birth, ID band Patient awake    Reviewed: Allergy & Precautions, NPO status , Patient's Chart, lab work & pertinent test results  Airway Mallampati: III  TM Distance: >3 FB Neck ROM: full    Dental no notable dental hx.    Pulmonary neg pulmonary ROS,    Pulmonary exam normal        Cardiovascular negative cardio ROS Normal cardiovascular exam     Neuro/Psych  Headaches, Seizures -, Well Controlled,  negative psych ROS   GI/Hepatic negative GI ROS, Neg liver ROS,   Endo/Other  negative endocrine ROS  Renal/GU      Musculoskeletal   Abdominal Normal abdominal exam  (+)   Peds  Hematology negative hematology ROS (+)   Anesthesia Other Findings Past Medical History: Breast cancer No date: Frequent headaches No date: Migraines No date: Seizures [facility])  Past Surgical History: 06/02/2022: BREAST BIOPSY; Left     Comment:  Stereo Bx Coil Clip - LCIS 06/21/2022: BREAST LUMPECTOMY; Left     Comment:  LCIS 2022: COLONOSCOPY     Reproductive/Obstetrics negative OB ROS                            Anesthesia Physical Anesthesia Plan  ASA: 2  Anesthesia Plan: General LMA   Post-op Pain Management: Regional block* and Minimal or no pain anticipated   Induction: Intravenous  PONV Risk Score and Plan: 3 and Dexamethasone, Ondansetron and Midazolam  Airway Management Planned: LMA  Additional Equipment:   Intra-op Plan:   Post-operative Plan: Extubation in OR  Informed Consent: I have reviewed the patients History and Physical, chart, labs and discussed the procedure including the risks, benefits and alternatives for the proposed anesthesia with the patient or authorized representative who has indicated his/her understanding and acceptance.     Dental Advisory Given  Plan Discussed with: Anesthesiologist, CRNA and  Surgeon  Anesthesia Plan Comments:        Anesthesia Quick Evaluation

## 2022-06-21 NOTE — H&P (Signed)
Sherry Mathis [id number] May 07, 1956     HPI:  Patient with recent biopsy showing atypical lobular hyperplasia.  For excision.   Medications Prior to Admission  Medication Sig Dispense Refill Last Dose   aspirin 81 MG tablet Take 81 mg by mouth daily.   06/15/2022   Calcium Carbonate (CALCIUM 500 PO) Take 500 mg by mouth 2 (two) times daily.   06/15/2022   Glucosamine-Chondroitin 750-600 MG CHEW Chew 1 capsule by mouth daily.   prn   No Known Allergies Past Medical History:  Diagnosis Date   Frequent headaches    Migraines    Seizures ([facility])    Past Surgical History:  Procedure Laterality Date   BREAST BIOPSY Left 06/02/2022   Stereo Bx Coil Clip - LCIS   BREAST LUMPECTOMY Left 06/21/2022   LCIS   COLONOSCOPY  2022   Social History   Socioeconomic History   Marital status: Married    Spouse name: Fayrene Fearing   Number of children: 1   Years of education: Not on file   Highest education level: Not on file  Occupational History   Not on file  Tobacco Use   Smoking status: Never   Smokeless tobacco: Never  Substance and Sexual Activity   Alcohol use: Yes    Alcohol/week: 0.0 standard drinks of alcohol    Comment: 6 oz year    Drug use: No   Sexual activity: Yes    Partners: Male    Comment: Husband   Other Topics Concern   Not on file  Social History Narrative   Caffeine- soda rare, tea 1 glass a week    Social Determinants of Health   Financial Resource Strain: Not on file  Food Insecurity: Not on file  Transportation Needs: Not on file  Physical Activity: Not on file  Stress: Not on file  Social Connections: Not on file  Intimate Partner Violence: Not on file   Social History   Social History Narrative   Caffeine- soda rare, tea 1 glass a week      ROS: Negative.     PE: HEENT: Negative. Lungs: Clear. Cardio: RR.   Assessment/Plan:  Proceed with planned excision of lobular hyperplasia.  Merrily P[facility] 06/21/2022

## 2022-06-21 NOTE — Op Note (Signed)
Preoperative diagnosis: Lobular hyperplasia, LCIS left breast.  Central chest wall nodule  Postoperative diagnosis: Same.  Operative procedure wire and ultrasound localization of the left breast with local excision.  Excision central chest wall nodule  Operating surgeon: Donnalee Curry, MD.  Anesthesia: General by LMA, Marcaine 0.5% with 1: 200,000 units of epinephrine.  Estimated blood loss: 5 cc.  Clinical note: This 66 year old man had an abnormality on screening mammogram and subsequent biopsy showed an area of LCIS.  She is brought to the op room at this time for limited incision to confirm no upstaging.  Antibiotics were not indicated.  Operative note: With the patient under adequate general anesthesia the breast was cleansed with ChloraPrep and draped.  Ultrasound was used to confirm the thickened portion of the wire in the area deep to the areola.  Local anesthesia was infiltrated as a field block.  A circumareolar incision from the 12 to 6 o'clock position was made.  The skin was incised sharply and remaining dissection completed electrocautery.  Subcutaneous fat, what little there was, was elevated to allow access to the wire which was brought into the field.  A cylinder of tissue perhaps a centimeter and a half in diameter was excised.  The previously placed coil clip was visualized in the specimen.  The breast parenchyma was exceptionally dense.  No lesions were seen.  Specimen radiograph confirmed the wire tip and coil.  This was sent for routine histology.  The dissection extended to the pectoralis fascia even with this limited sample.  The deep tissue was approximated with interrupted 2-0 Vicryl figure-of-eight sutures.  The thin layer of adipose tissue was approximated with simple sutures of 2-0 Vicryl.  The skin was closed with interrupted 4-0 Vicryl subcuticular sutures.  The nodular area on the top of the sternum just below the manubrium was excised through an elliptical incision.   Hemostasis was electrocautery.  A nylon stitch was placed in the 12 o'clock position of the transversely oriented specimen.  The skin was closed with interrupted 4-0 Vicryl subcuticular sutures.  Benzoin, Steri-Strips, Telfa and Tegaderm dressing applied.  Benzoin and Steri-Strips were applied to the breast incision followed by Telfa and fluff gauze.  A compressive wrap was applied.  Patient tolerated procedure well and was taken to the PACU in stable condition.

## 2022-06-22 ENCOUNTER — Encounter: Payer: Self-pay | Admitting: General Surgery

## 2022-06-24 LAB — SURGICAL PATHOLOGY

## 2022-11-16 ENCOUNTER — Other Ambulatory Visit: Payer: Self-pay | Admitting: Family Medicine

## 2022-11-16 DIAGNOSIS — Z1231 Encounter for screening mammogram for malignant neoplasm of breast: Secondary | ICD-10-CM

## 2023-02-24 ENCOUNTER — Other Ambulatory Visit: Payer: Self-pay | Admitting: Surgery

## 2023-02-24 DIAGNOSIS — Z853 Personal history of malignant neoplasm of breast: Secondary | ICD-10-CM

## 2023-03-15 ENCOUNTER — Ambulatory Visit: Payer: Medicare HMO | Admitting: Podiatry

## 2023-03-15 ENCOUNTER — Encounter: Payer: Self-pay | Admitting: Podiatry

## 2023-03-15 DIAGNOSIS — L600 Ingrowing nail: Secondary | ICD-10-CM | POA: Diagnosis not present

## 2023-03-15 MED ORDER — NEOMYCIN-POLYMYXIN-HC 1 % OT SOLN: OTIC | 1 refills | Status: AC

## 2023-03-15 NOTE — Progress Notes (Signed)
Subjective:  Patient ID: Sherry Mathis, female    DOB: 08/14/1956,  MRN: [id number] HPI Chief Complaint  Patient presents with   Toe Pain    2nd toe right - thick toenail x years, if she doesn't keep cut short, it hurts, also hallux right was removed by Dr. Jacqualyn Posey and the medial border has like a jagged ridge in it   New Patient (Initial Visit)    Est pt 2016    67 y.o. female presents with the above complaint.   ROS: Denies fever chills nausea vomit muscle aches pains calf pain back pain chest pain shortness of breath.  Past Medical History:  Diagnosis Date   Frequent headaches    Migraines    Seizures ([facility])    Past Surgical History:  Procedure Laterality Date   BREAST BIOPSY Left 06/02/2022   Stereo Bx Coil Clip - LCIS   BREAST LUMPECTOMY Left 06/21/2022   LCIS   BREAST LUMPECTOMY WITH NEEDLE LOCALIZATION Left 06/21/2022   Procedure: BREAST LUMPECTOMY WITH NEEDLE LOCALIZATION;  Surgeon: Robert Bellow, MD;  Location: [facility] ORS;  Service: General;  Laterality: Left;   COLONOSCOPY  2022   MOLE REMOVAL N/A 06/21/2022   Procedure: MOLE REMOVAL;  Surgeon: Robert Bellow, MD;  Location: [facility] ORS;  Service: General;  Laterality: N/A;  chest wall    Current Outpatient Medications:    NEOMYCIN-POLYMYXIN-HYDROCORTISONE (CORTISPORIN) 1 % SOLN OTIC solution, Apply 1-2 drops to toe BID after soaking, Disp: 10 mL, Rfl: 1   aspirin 81 MG tablet, Take 81 mg by mouth daily., Disp: , Rfl:    Calcium Carbonate (CALCIUM 500 PO), Take 500 mg by mouth 2 (two) times daily., Disp: , Rfl:    Glucosamine-Chondroitin 750-600 MG CHEW, Chew 1 capsule by mouth daily., Disp: , Rfl:    tamoxifen (NOLVADEX) 20 MG tablet, Take 20 mg by mouth daily., Disp: , Rfl:   No Known Allergies Review of Systems Objective:  There were no vitals filed for this visit.  General: Well developed, nourished, in no acute distress, alert and oriented x3   Dermatological: Skin is warm, dry and supple bilateral.  Nails x 10 are well maintained; remaining integument appears unremarkable at this time. There are no open sores, no preulcerative lesions, no rash or signs of infection present.  Mild hammertoe deformity resulting in a nail dystrophy second digit right foot.  Vascular: Dorsalis Pedis artery and Posterior Tibial artery pedal pulses are 2/4 bilateral with immedate capillary fill time. Pedal hair growth present. No varicosities and no lower extremity edema present bilateral.   Neruologic: Grossly intact via light touch bilateral. Vibratory intact via tuning fork bilateral. Protective threshold with Semmes Wienstein monofilament intact to all pedal sites bilateral. Patellar and Achilles deep tendon reflexes 2+ bilateral. No Babinski or clonus noted bilateral.   Musculoskeletal: No gross boney pedal deformities bilateral. No pain, crepitus, or limitation noted with foot and ankle range of motion bilateral. Muscular strength 5/5 in all groups tested bilateral.  Gait: Unassisted, Nonantalgic.    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  Given both oral and home-going instruction for the care and soaking of the toe as well as a prescription for Cortisporin Otic to be applied twice daily.  I will follow-up with her in 2 to 3 weeks.  Garrel Ridgel, DPM

## 2023-03-15 NOTE — Patient Instructions (Signed)

## 2023-04-06 ENCOUNTER — Encounter: Payer: Self-pay | Admitting: Podiatry

## 2023-04-06 ENCOUNTER — Ambulatory Visit: Payer: Medicare HMO | Admitting: Podiatry

## 2023-04-06 ENCOUNTER — Ambulatory Visit (INDEPENDENT_AMBULATORY_CARE_PROVIDER_SITE_OTHER): Payer: Medicare HMO | Admitting: Podiatry

## 2023-04-06 DIAGNOSIS — Z9889 Other specified postprocedural states: Secondary | ICD-10-CM

## 2023-04-06 DIAGNOSIS — L600 Ingrowing nail: Secondary | ICD-10-CM

## 2023-04-06 NOTE — Progress Notes (Signed)
She presents today for follow-up of her total matricectomy second digit of the right foot.  She states that it feels fine it looks fine had to switch from the use of the Betadine to vinegar and warm water everything is doing just great no problems whatsoever.  Objective: Vital signs stable alert oriented x 3 some dried eschar around the dorsal aspect of the second toe appears to be healing very nicely no signs of infection there is no erythema cellulitis drainage or odor.  Assessment: Healing surgical toe.  Plan: Follow-up with me on an as-needed basis.

## 2023-05-12 ENCOUNTER — Ambulatory Visit
Admission: RE | Admit: 2023-05-12 | Discharge: 2023-05-12 | Disposition: A | Discharge status: Home / Self Care | Payer: Medicare HMO | Source: Ambulatory Visit | Attending: Surgery | Admitting: Surgery

## 2023-05-12 DIAGNOSIS — Z853 Personal history of malignant neoplasm of breast: Secondary | ICD-10-CM

## 2024-03-20 ENCOUNTER — Other Ambulatory Visit: Payer: Self-pay | Admitting: Surgery

## 2024-03-20 DIAGNOSIS — Z1231 Encounter for screening mammogram for malignant neoplasm of breast: Secondary | ICD-10-CM

## 2024-05-15 ENCOUNTER — Ambulatory Visit
Admission: RE | Admit: 2024-05-15 | Discharge: 2024-05-15 | Disposition: A | Discharge status: Home / Self Care | Source: Ambulatory Visit | Attending: Surgery | Admitting: Surgery

## 2024-05-15 DIAGNOSIS — Z1231 Encounter for screening mammogram for malignant neoplasm of breast: Secondary | ICD-10-CM | POA: Insufficient documentation

## 2025-02-26 ENCOUNTER — Ambulatory Visit: Admitting: Nurse Practitioner
